# Patient Record
Sex: Male | Born: 1970 | Race: Black or African American | Hispanic: No | Marital: Married | Smoking: Former smoker
Health system: Southern US, Community
[De-identification: ages and names within clinical notes are randomized; demographics above are authoritative.]

---

## 2006-06-30 ENCOUNTER — Emergency Department (HOSPITAL_COMMUNITY): Admission: EM | Admit: 2006-06-30 | Discharge: 2006-07-01 | Payer: Self-pay | Admitting: Emergency Medicine
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
## 2018-03-02 ENCOUNTER — Ambulatory Visit (HOSPITAL_COMMUNITY)
Admission: EM | Admit: 2018-03-02 | Discharge: 2018-03-02 | Disposition: A | Discharge status: Home / Self Care | Payer: Medicaid Other | Attending: Family Medicine | Admitting: Family Medicine

## 2018-03-02 ENCOUNTER — Other Ambulatory Visit: Payer: Self-pay

## 2018-03-02 ENCOUNTER — Encounter (HOSPITAL_COMMUNITY): Payer: Self-pay | Admitting: Emergency Medicine

## 2018-03-02 DIAGNOSIS — Z202 Contact with and (suspected) exposure to infections with a predominantly sexual mode of transmission: Secondary | ICD-10-CM | POA: Insufficient documentation

## 2018-03-02 DIAGNOSIS — Z113 Encounter for screening for infections with a predominantly sexual mode of transmission: Secondary | ICD-10-CM | POA: Diagnosis not present

## 2018-03-02 NOTE — ED Notes (Signed)
Specimen obtained and placed in lab

## 2018-03-02 NOTE — ED Triage Notes (Signed)
Pt is here because his male partner is here because she has been having some vaginal irritation.  Pt denies any symptoms.

## 2018-03-02 NOTE — Discharge Instructions (Signed)
We have sent testing for sexually transmitted infections. We will notify you of any positive results once they are received. If required, we will prescribe any medications you might need. °

## 2018-03-03 LAB — URINE CYTOLOGY ANCILLARY ONLY
Chlamydia: NEGATIVE
Neisseria Gonorrhea: NEGATIVE
TRICH (WINDOWPATH): POSITIVE — AB

## 2018-03-03 LAB — RPR: RPR Ser Ql: NONREACTIVE

## 2018-03-03 LAB — HIV ANTIBODY (ROUTINE TESTING W REFLEX): HIV Screen 4th Generation wRfx: NONREACTIVE

## 2018-03-04 ENCOUNTER — Telehealth (HOSPITAL_COMMUNITY): Payer: Self-pay

## 2018-03-04 MED ORDER — METRONIDAZOLE 500 MG PO TABS: 500.0000 mg | ORAL_TABLET | Freq: Two times a day (BID) | ORAL | 0 refills | Status: AC

## 2018-03-04 NOTE — Telephone Encounter (Signed)
Trichomonas is positive. Rx metronidazole 500mg bid x 7d #14 no refills was sent to the pharmacy of record. PT called and made aware.  Educated patient to refrain from sexual intercourse for 7 days to give the medicine time to work. Sexual partners need to be notified and tested/treated. Condoms may reduce risk of reinfection.  Recheck for further evaluation if symptoms are not improving. Pt verbalized understanding.   

## 2018-03-16 NOTE — ED Provider Notes (Signed)
  [facility]   [id number] 03/02/18 Arrival Time: 1534  ASSESSMENT & PLAN:  1. Screen for STD (sexually transmitted disease)      Discharge Instructions     We have sent testing for sexually transmitted infections. We will notify you of any positive results once they are received. If required, we will prescribe any medications you might need.    Pending: Labs Reviewed  URINE CYTOLOGY ANCILLARY ONLY  RPR  HIV ANTIBODY (ROUTINE TESTING)   Will notify of any positive results. Instructed to refrain from sexual activity for at least seven days.  Reviewed expectations re: course of current medical issues. Questions answered. Outlined signs and symptoms indicating need for more acute intervention. Patient verbalized understanding. After Visit Summary given.   SUBJECTIVE:  Tyler Casey is a 47 y.o. male who requests screening for STDs. Urinary symptoms: none. Afebrile. No abdominal or pelvic pain. No n/v. No rashes or lesions. Sexually active with single male partner. No h/o STD reported.  ROS: As per HPI.  OBJECTIVE:  Vitals:   03/02/18 1607  BP: (!) 115/53  Pulse: 66  Temp: 97.9 F (36.6 C)  TempSrc: Oral  SpO2: 98%     General appearance: alert, cooperative, appears stated age and no distress Throat: lips, mucosa, and tongue normal; teeth and gums normal Back: no CVA tenderness Abdomen: soft, non-tender GU: declines Skin: warm and dry Psychological:  Alert and cooperative. Normal mood and affect.    No Known Allergies   Social History   Socioeconomic History  . Marital status: Married    Spouse name: Not on file  . Number of children: Not on file  . Years of education: Not on file  . Highest education level: Not on file  Occupational History  . Not on file  Social Needs  . Financial resource strain: Not on file  . Food insecurity:    Worry: Not on file    Inability: Not on file  . Transportation needs:    Medical: Not on file   Non-medical: Not on file  Tobacco Use  . Smoking status: Former Smoker    Types: Cigarettes    Last attempt to quit: 1925    Years since quitting: 94.5  . Smokeless tobacco: Never Used  Substance and Sexual Activity  . Alcohol use: Not Currently  . Drug use: Yes    Frequency: 7.0 times per week    Types: Marijuana  . Sexual activity: Not on file  Lifestyle  . Physical activity:    Days per week: Not on file    Minutes per session: Not on file  . Stress: Not on file  Relationships  . Social connections:    Talks on phone: Not on file    Gets together: Not on file    Attends religious service: Not on file    Active member of club or organization: Not on file    Attends meetings of clubs or organizations: Not on file    Relationship status: Not on file  . Intimate partner violence:    Fear of current or ex partner: Not on file    Emotionally abused: Not on file    Physically abused: Not on file    Forced sexual activity: Not on file  Other Topics Concern  . Not on file  Social History Narrative  . Not on file          Mardella LaymanHagler, Randall Colden, MD 03/16/18 1009
